# Patient Record
Sex: Female | Born: 1985 | Race: White | Hispanic: No | Marital: Married
Health system: Southern US, Community
[De-identification: ages and names within clinical notes are randomized; demographics above are authoritative.]

## PROBLEM LIST (undated history)

## (undated) ENCOUNTER — Inpatient Hospital Stay (HOSPITAL_COMMUNITY): Payer: Self-pay

---
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
## 2018-01-02 ENCOUNTER — Encounter (HOSPITAL_COMMUNITY): Payer: Self-pay

## 2018-01-02 ENCOUNTER — Other Ambulatory Visit: Payer: Self-pay

## 2018-01-02 ENCOUNTER — Inpatient Hospital Stay (HOSPITAL_COMMUNITY)
Admission: AD | Admit: 2018-01-02 | Discharge: 2018-01-02 | Disposition: A | Discharge status: Home / Self Care | Payer: Managed Care, Other (non HMO) | Source: Ambulatory Visit | Attending: Obstetrics and Gynecology | Admitting: Obstetrics and Gynecology

## 2018-01-02 ENCOUNTER — Inpatient Hospital Stay (HOSPITAL_COMMUNITY): Payer: Managed Care, Other (non HMO)

## 2018-01-02 DIAGNOSIS — O208 Other hemorrhage in early pregnancy: Secondary | ICD-10-CM | POA: Insufficient documentation

## 2018-01-02 DIAGNOSIS — O468X1 Other antepartum hemorrhage, first trimester: Secondary | ICD-10-CM

## 2018-01-02 DIAGNOSIS — O209 Hemorrhage in early pregnancy, unspecified: Secondary | ICD-10-CM | POA: Diagnosis not present

## 2018-01-02 DIAGNOSIS — Z3A01 Less than 8 weeks gestation of pregnancy: Secondary | ICD-10-CM | POA: Insufficient documentation

## 2018-01-02 DIAGNOSIS — O418X10 Other specified disorders of amniotic fluid and membranes, first trimester, not applicable or unspecified: Secondary | ICD-10-CM | POA: Diagnosis not present

## 2018-01-02 DIAGNOSIS — Z3490 Encounter for supervision of normal pregnancy, unspecified, unspecified trimester: Secondary | ICD-10-CM

## 2018-01-02 LAB — CBC WITH DIFFERENTIAL/PLATELET
Basophils Absolute: 0 10*3/uL (ref 0.0–0.1)
Basophils Relative: 0 %
Eosinophils Absolute: 0.1 10*3/uL (ref 0.0–0.5)
Eosinophils Relative: 1 %
HCT: 40.3 % (ref 36.0–46.0)
Hemoglobin: 13.7 g/dL (ref 12.0–15.0)
Lymphocytes Relative: 27 %
Lymphs Abs: 2.6 10*3/uL (ref 0.7–4.0)
MCH: 30.2 pg (ref 26.0–34.0)
MCHC: 34 g/dL (ref 30.0–36.0)
MCV: 89 fL (ref 80.0–100.0)
Monocytes Absolute: 0.4 10*3/uL (ref 0.1–1.0)
Monocytes Relative: 4 %
Neutro Abs: 6.6 10*3/uL (ref 1.7–7.7)
Neutrophils Relative %: 68 %
Platelets: 194 10*3/uL (ref 150–400)
RBC: 4.53 MIL/uL (ref 3.87–5.11)
RDW: 12.2 % (ref 11.5–15.5)
WBC: 9.7 10*3/uL (ref 4.0–10.5)
nRBC: 0 % (ref 0.0–0.2)

## 2018-01-02 LAB — URINALYSIS, ROUTINE W REFLEX MICROSCOPIC
Bilirubin Urine: NEGATIVE
Glucose, UA: NEGATIVE mg/dL
Ketones, ur: NEGATIVE mg/dL
Leukocytes, UA: NEGATIVE
Nitrite: NEGATIVE
Protein, ur: NEGATIVE mg/dL
Specific Gravity, Urine: 1.002 — ABNORMAL LOW (ref 1.005–1.030)
pH: 6 (ref 5.0–8.0)

## 2018-01-02 LAB — TYPE AND SCREEN
ABO/RH(D): O POS
Antibody Screen: NEGATIVE

## 2018-01-02 LAB — ABO/RH: ABO/RH(D): O POS

## 2018-01-02 LAB — HCG, QUANTITATIVE, PREGNANCY: HCG, BETA CHAIN, QUANT, S: 8596 m[IU]/mL — AB (ref <5)

## 2018-01-02 LAB — POCT PREGNANCY, URINE: Preg Test, Ur: POSITIVE — AB

## 2018-01-02 NOTE — MAU Provider Note (Signed)
History     CSN: [id number]  Arrival date and time: 01/02/18 1655   First Provider Initiated Contact with Patient 01/02/18 1731      Chief Complaint  Patient presents with  . Abdominal Pain  . Vaginal Bleeding   HPI   Michelle Sparks is a 32 y.o. G1P0 at [redacted]w[redacted]d by LMP who presents to MAU with concern for heavy vaginal bleeding in first trimester. This is a new problem, onset this afternoon at about 2pm. Patient endorses previous episodes of vaginal spotting for which she was seen in clinic. However, today her spotting became "very heavy and was running down my leg". She denies abdominal cramping, abnormal vaginal discharge, SOB, fever or recent illness.    Abdominal cramping This is a new problem, occurring throughout pregnancy and coinciding with previous episodes of vaginal spotting. Patient endorses mild abdominal cramping, "I'm barely aware of it". Bilateral low abdomen, does not radiate, no aggravating or alleviating factors. Has not taken medication or tried other treatments.  Patient states she had Quant hCGs collected on 53 and 21 Nov. She reports a value of 400 on 11/19 and states she was told her 11/21 showed "the normal amount of rise for a good pregnancy".     OB History    Gravida  1   Para      Term      Preterm      AB      Living        SAB      TAB      Ectopic      Multiple      Live Births              No past medical history on file.   No family history on file.  Social History   Tobacco Use  . Smoking status: Not on file  Substance Use Topics  . Alcohol use: Not on file  . Drug use: Not on file    Allergies: Not on File  No medications prior to admission.    Review of Systems  Respiratory: Negative for shortness of breath.   Gastrointestinal: Positive for abdominal pain.  Genitourinary: Negative for vaginal bleeding.  All other systems reviewed and are negative.  Physical Exam   Blood pressure 124/78, pulse 72,  temperature (!) 97.3 F (36.3 C), temperature source Oral, resp. rate 16, height 5\' 7"  (1.702 m), weight 78.4 kg, last menstrual period 11/16/2017.  Physical Exam  Nursing note and vitals reviewed. Constitutional: She appears well-developed and well-nourished.  Cardiovascular: Normal rate.  Respiratory: Effort normal.  GI: Soft. She exhibits no distension. There is no tenderness. There is no rebound, no guarding and no CVA tenderness.  Genitourinary: Vaginal discharge found.  Genitourinary Comments: Scant vaginal bleeding  Skin: Skin is warm and dry.  Psychiatric: She has a normal mood and affect. Her behavior is normal. Judgment and thought content normal.    MAU Course/MDM   --Ultrasound report reviewed --No concerning findings on physical exam  Patient Vitals for the past 24 hrs:  BP Temp Temp src Pulse Resp Height Weight  01/02/18 1925 124/78 - - 72 16 - -  01/02/18 1712 128/67 (!) 97.3 F (36.3 C) Oral 84 18 5\' 7"  (1.702 m) 78.4 kg    Results for orders placed or performed during the hospital encounter of 01/02/18 (from the past 24 hour(s))  Urinalysis, Routine w reflex microscopic     Status: Abnormal   Collection Time: 01/02/18  5:15 PM  Result Value Ref Range   Color, Urine STRAW (A) YELLOW   APPearance HAZY (A) CLEAR   Specific Gravity, Urine 1.002 (L) 1.005 - 1.030   pH 6.0 5.0 - 8.0   Glucose, UA NEGATIVE NEGATIVE mg/dL   Hgb urine dipstick LARGE (A) NEGATIVE   Bilirubin Urine NEGATIVE NEGATIVE   Ketones, ur NEGATIVE NEGATIVE mg/dL   Protein, ur NEGATIVE NEGATIVE mg/dL   Nitrite NEGATIVE NEGATIVE   Leukocytes, UA NEGATIVE NEGATIVE   RBC / HPF 11-20 0 - 5 RBC/hpf   WBC, UA 0-5 0 - 5 WBC/hpf   Bacteria, UA MANY (A) NONE SEEN  Pregnancy, urine POC     Status: Abnormal   Collection Time: 01/02/18  5:16 PM  Result Value Ref Range   Preg Test, Ur POSITIVE (A) NEGATIVE  CBC with Differential/Platelet     Status: None   Collection Time: 01/02/18  6:04 PM  Result  Value Ref Range   WBC 9.7 4.0 - 10.5 K/uL   RBC 4.53 3.87 - 5.11 MIL/uL   Hemoglobin 13.7 12.0 - 15.0 g/dL   HCT 78.440.3 69.636.0 - 29.546.0 %   MCV 89.0 80.0 - 100.0 fL   MCH 30.2 26.0 - 34.0 pg   MCHC 34.0 30.0 - 36.0 g/dL   RDW 28.412.2 13.211.5 - 44.015.5 %   Platelets 194 150 - 400 K/uL   nRBC 0.0 0.0 - 0.2 %   Neutrophils Relative % 68 %   Neutro Abs 6.6 1.7 - 7.7 K/uL   Lymphocytes Relative 27 %   Lymphs Abs 2.6 0.7 - 4.0 K/uL   Monocytes Relative 4 %   Monocytes Absolute 0.4 0.1 - 1.0 K/uL   Eosinophils Relative 1 %   Eosinophils Absolute 0.1 0.0 - 0.5 K/uL   Basophils Relative 0 %   Basophils Absolute 0.0 0.0 - 0.1 K/uL  hCG, quantitative, pregnancy     Status: Abnormal   Collection Time: 01/02/18  6:04 PM  Result Value Ref Range   hCG, Beta Chain, Quant, S 8,596 (H) <5 mIU/mL  Type and screen [facility]     Status: None   Collection Time: 01/02/18  6:04 PM  Result Value Ref Range   ABO/RH(D) O POS    Antibody Screen NEG    Sample Expiration      01/05/2018 Performed at [facility], [address] [place], [place] [id number]    [place] Ob Less Than 14 Weeks With Ob Transvaginal  Result Date: 01/02/2018 CLINICAL DATA:  Vaginal bleeding EXAM: OBSTETRIC <14 WK US AND TRANSVAGINAL OB US TECHNIQUE: Both transabdominal and transvaginal ultrasound examinations were performed for complete evaluation of the gestation as well as the maternal uterus, adnexal regions, and pelvic cul-de-sac. Transvaginal technique was performed to assess early pregnancy. COMPARISON:  None. FINDINGS: Intrauterine gestational sac: Single Yolk sac:  Visualized Embryo:  Visualized Cardiac Activity: Visualized Heart Rate: 103 bpm MSD:   mm    w     d CRL:  1.6 mm, too small to date Subchorionic hemorrhage:  Large subchorionic hemorrhage Maternal uterus/adnexae: Moderate free fluid. Bilateral simple appearing ovarian cysts, 5 cm on the right and 3.2 cm on the left. IMPRESSION: Intrauterine gestation with  early embryo, too small to date. Large subchorionic hemorrhage. Bilateral simple appearing ovarian cysts, the largest 5 cm on the right. Electronically Signed   By: Charlett NoseKevin  Dover M.D.   On: 01/02/2018 19:19    Assessment and Plan  --32 y.o. G1P0 with  confirmed IUP at [redacted]w[redacted]d  --Subchorionic hemorrhage, reviewed pelvic rest and bleeding precautions --O POSITIVE: Rhogam not indicated --Discharge home in stable condition  Calvert Cantor, CNM 01/02/2018, 7:38 PM

## 2018-01-02 NOTE — MAU Note (Signed)
Reports she is [redacted] weeks pregnant  Had HCG in the office and thinks they were about 400 the first time and states they called her with the results and she states she cant remember the number but the office told her the results were good  Today started having some dark bleeding and now it is more red. Sees it when shes wiping and now it is dripping into the toilet  Having some cramping, cramping has been ongoing and is not worse today then before

## 2018-01-02 NOTE — Discharge Instructions (Signed)
Subchorionic Hematoma °A subchorionic hematoma is a gathering of blood between the outer wall of the placenta and the inner wall of the womb (uterus). The placenta is the organ that connects the fetus to the wall of the uterus. The placenta performs the feeding, breathing (oxygen to the fetus), and waste removal (excretory work) of the fetus. °Subchorionic hematoma is the most common abnormality found on a result from ultrasonography done during the first trimester or early second trimester of pregnancy. If there has been little or no vaginal bleeding, early small hematomas usually shrink on their own and do not affect your baby or pregnancy. The blood is gradually absorbed over 1-2 weeks. When bleeding starts later in pregnancy or the hematoma is larger or occurs in an older pregnant woman, the outcome may not be as good. Larger hematomas may get bigger, which increases the chances for miscarriage. Subchorionic hematoma also increases the risk of premature detachment of the placenta from the uterus, preterm (premature) labor, and stillbirth. °Follow these instructions at home: °· Stay on bed rest if your health care provider recommends this. Although bed rest will not prevent more bleeding or prevent a miscarriage, your health care provider may recommend bed rest until you are advised otherwise. °· Avoid heavy lifting (more than 10 lb [4.5 kg]), exercise, sexual intercourse, or douching as directed by your health care provider. °· Keep track of the number of pads you use each day and how soaked (saturated) they are. Write down this information. °· Do not use tampons. °· Keep all follow-up appointments as directed by your health care provider. Your health care provider may ask you to have follow-up blood tests or ultrasound tests or both. °Get help right away if: °· You have severe cramps in your stomach, back, abdomen, or pelvis. °· You have a fever. °· You pass large clots or tissue. Save any tissue for your  health care provider to look at. °· Your bleeding increases or you become lightheaded, feel weak, or have fainting episodes. °This information is not intended to replace advice given to you by your health care provider. Make sure you discuss any questions you have with your health care provider. °Document Released: 05/06/2006 Document Revised: 06/27/2015 Document Reviewed: 08/18/2012 °Elsevier Interactive Patient Education © 2017 [employer] ° °

## 2018-01-02 NOTE — MAU Note (Signed)
Cramping and bleeding this morning.  Bleeding started as dark brown and now is a brighter red, she notices the bleeding when she wipes and it drips when she goes to the bathroom.

## 2018-11-15 ENCOUNTER — Encounter (HOSPITAL_COMMUNITY): Payer: Self-pay

## 2019-04-26 IMAGING — US US OB < 14 WEEKS - US OB TV
1 series · 15 of 28 positions shown · non-contrast
Comparison: None.

CLINICAL DATA: Vaginal bleeding

EXAM:
OBSTETRIC <14 WK US AND TRANSVAGINAL OB US
TECHNIQUE: Both transabdominal and transvaginal ultrasound examinations were
performed for complete evaluation of the gestation as well as the
maternal uterus, adnexal regions, and pelvic cul-de-sac.
Transvaginal technique was performed to assess early pregnancy.

[Series 1: us ob < 14 weeks - us ob tv · 15 of 68 slices shown]
[im 1/68]
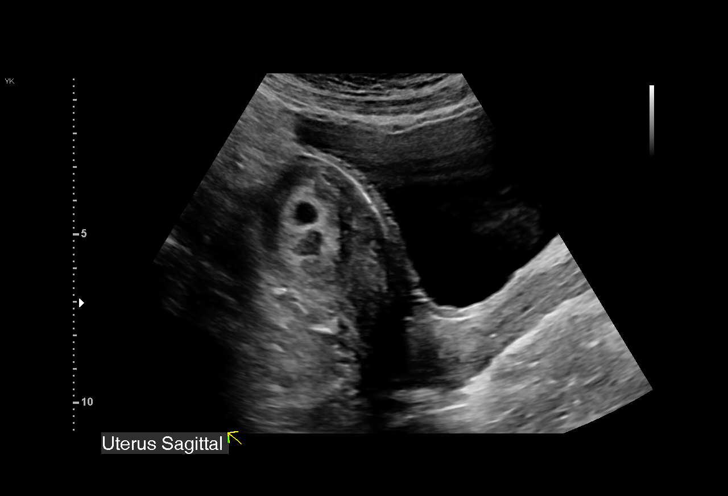
[im 5/68]
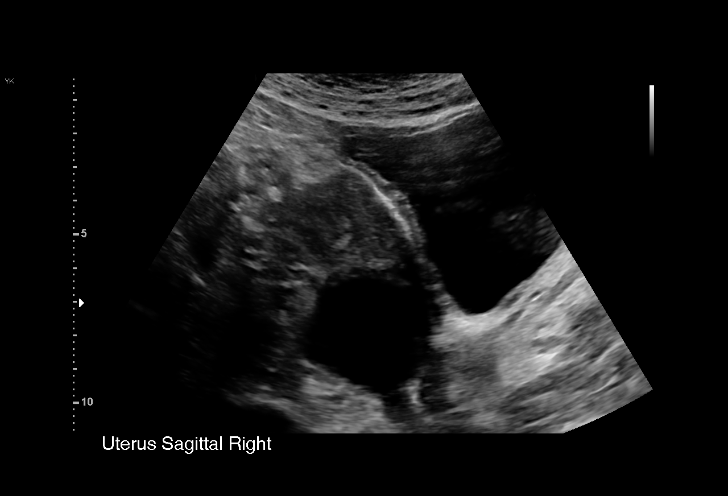
[im 10/68]
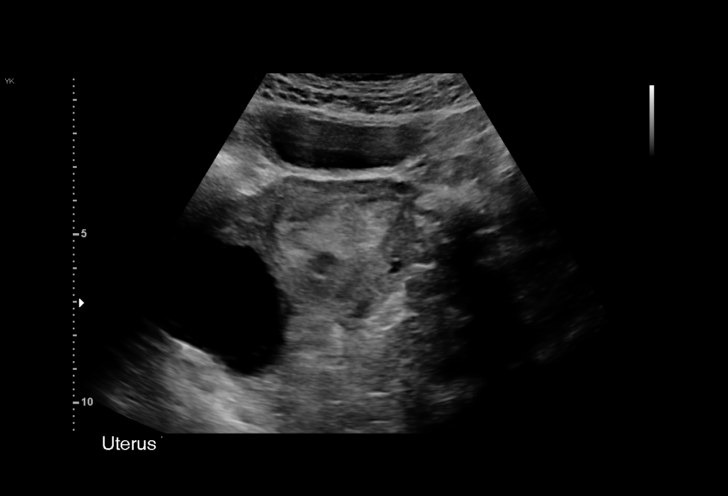
[im 15/68]
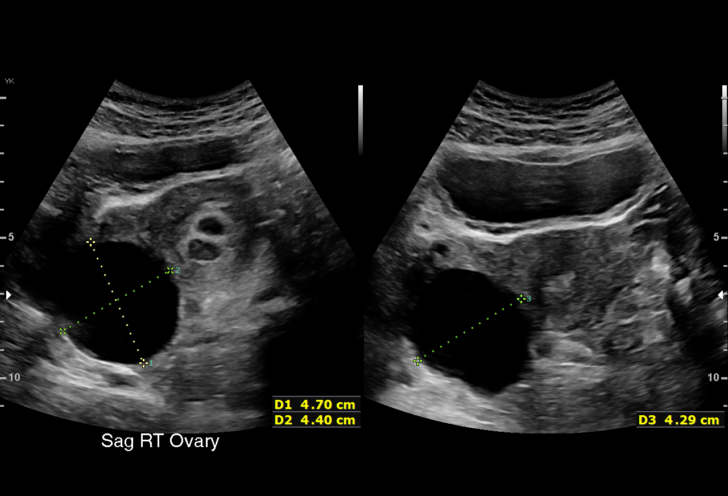
[im 20/68]
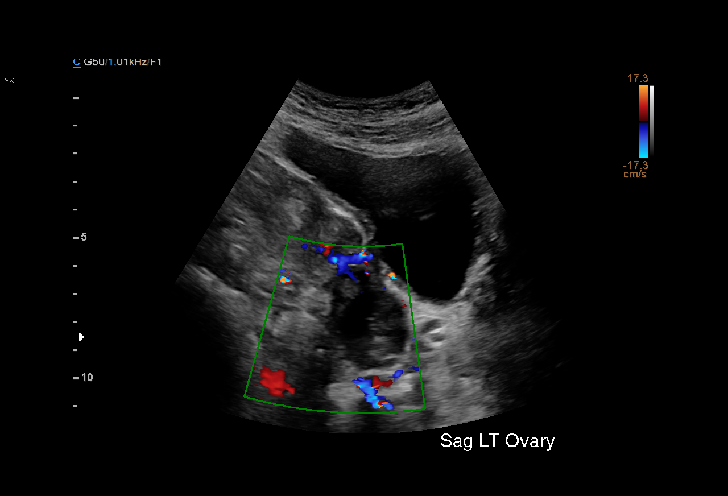
[im 25/68]
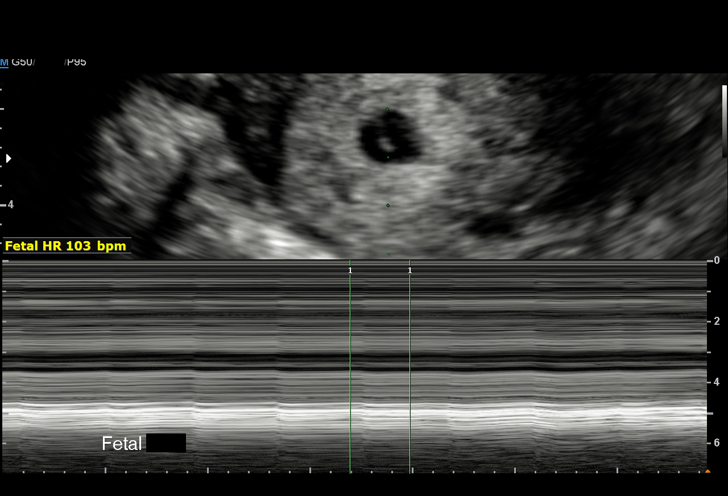
[im 30/68]
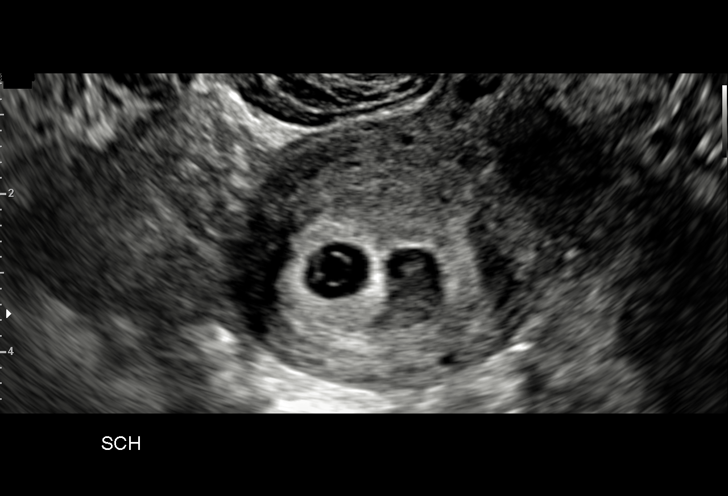
[im 35/68]
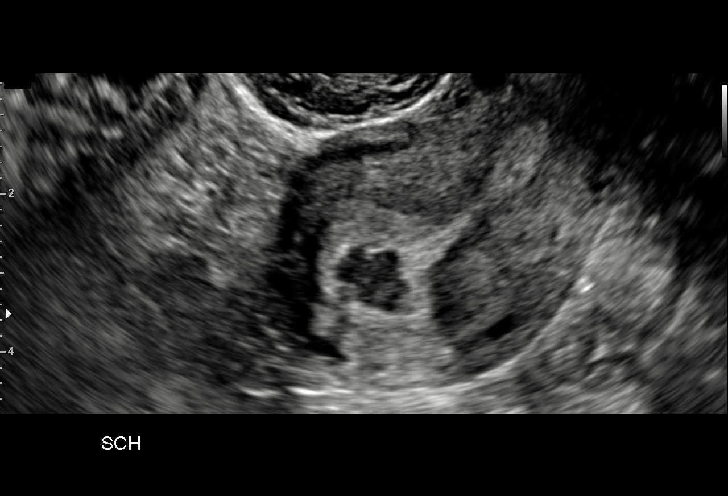
[im 38/68]
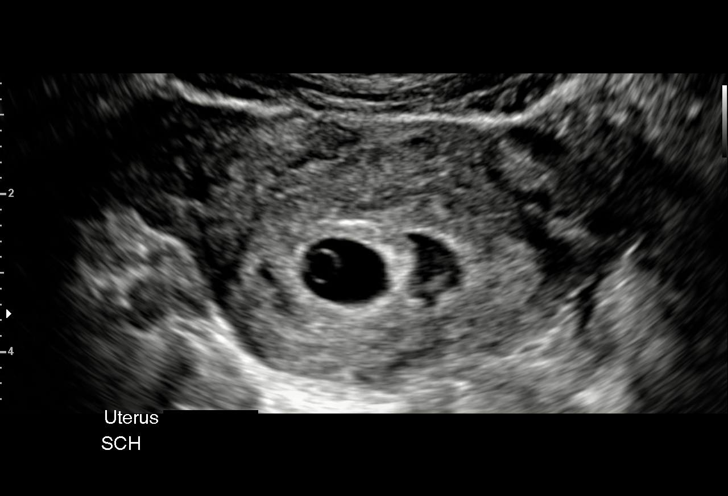
[im 43/68]
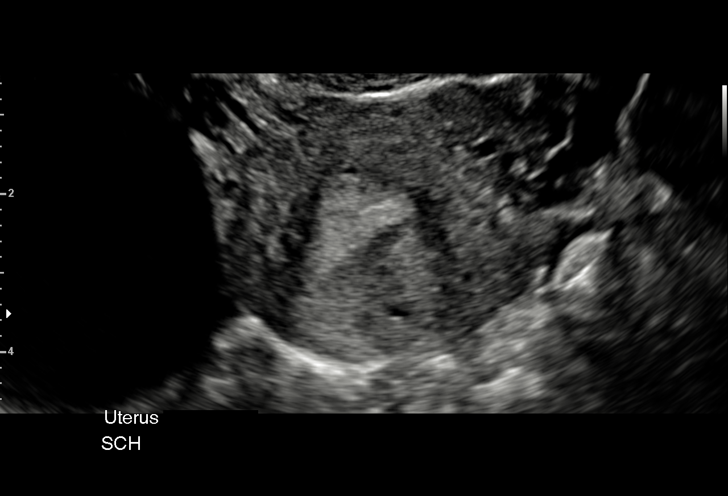
[im 48/68]
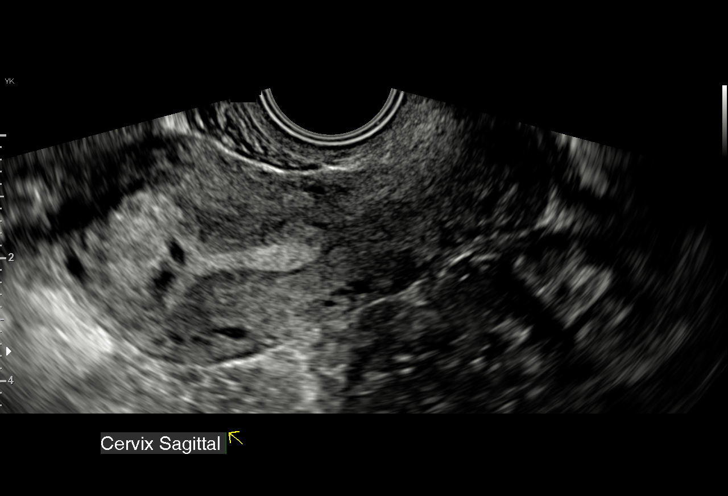
[im 53/68]
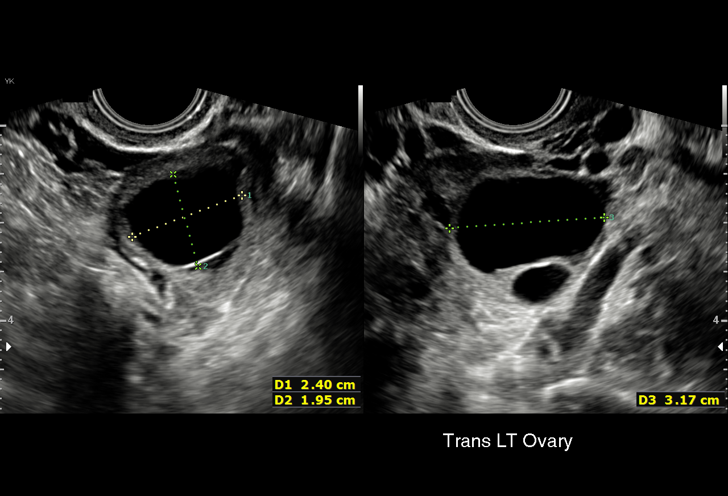
[im 58/68]
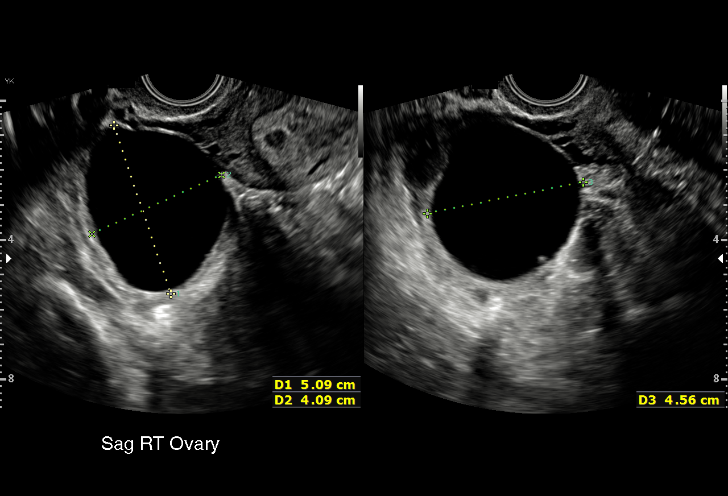
[im 63/68]
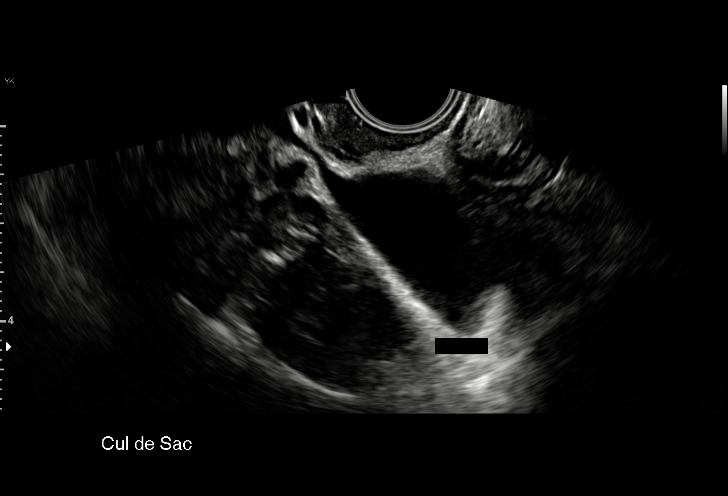
[im 68/68]
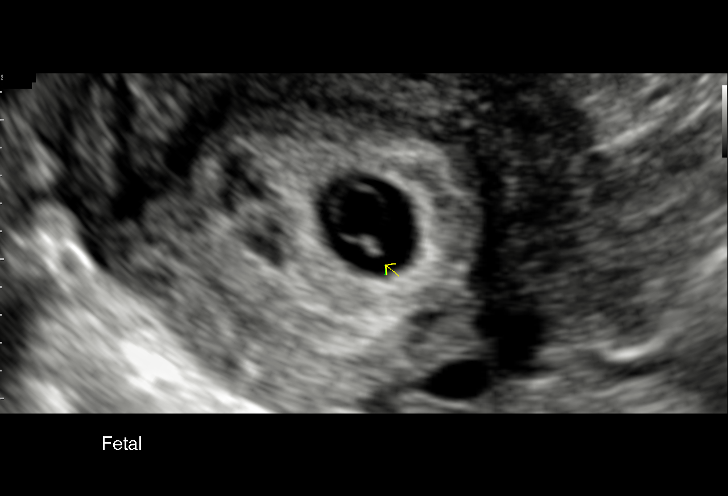

[15 of 28 positions shown; findings below may reference images not displayed]

FINDINGS: Intrauterine gestational sac: Single

Yolk sac:  Visualized

Embryo:  Visualized

Cardiac Activity: Visualized

Heart Rate: 103 bpm

MSD:   mm    w     d

CRL:  1.6 mm, too small to date

Subchorionic hemorrhage:  Large subchorionic hemorrhage

Maternal uterus/adnexae: Moderate free fluid. Bilateral simple
appearing ovarian cysts, 5 cm on the right and 3.2 cm on the left.
IMPRESSION: Intrauterine gestation with early embryo, too small to date. Large
subchorionic hemorrhage.

Bilateral simple appearing ovarian cysts, the largest 5 cm on the
right.
